# Patient Record
Sex: Male | Born: 1975 | Race: Black or African American | Hispanic: No | Marital: Single | Smoking: Former smoker
Health system: Southern US, Community
[De-identification: ages and names within clinical notes are randomized; demographics above are authoritative.]

---

## 2011-11-27 ENCOUNTER — Emergency Department (HOSPITAL_COMMUNITY): Payer: 59

## 2011-11-27 ENCOUNTER — Encounter (HOSPITAL_COMMUNITY): Payer: Self-pay

## 2011-11-27 ENCOUNTER — Inpatient Hospital Stay (HOSPITAL_COMMUNITY)
Admission: EM | Admit: 2011-11-27 | Discharge: 2011-11-29 | DRG: 089 | Disposition: A | Discharge status: Home / Self Care | Payer: 59 | Attending: Emergency Medicine | Admitting: Emergency Medicine

## 2011-11-27 DIAGNOSIS — D62 Acute posthemorrhagic anemia: Secondary | ICD-10-CM | POA: Diagnosis present

## 2011-11-27 DIAGNOSIS — S32009A Unspecified fracture of unspecified lumbar vertebra, initial encounter for closed fracture: Secondary | ICD-10-CM

## 2011-11-27 DIAGNOSIS — S129XXA Fracture of neck, unspecified, initial encounter: Secondary | ICD-10-CM

## 2011-11-27 DIAGNOSIS — S27329A Contusion of lung, unspecified, initial encounter: Secondary | ICD-10-CM | POA: Diagnosis present

## 2011-11-27 DIAGNOSIS — S301XXA Contusion of abdominal wall, initial encounter: Secondary | ICD-10-CM

## 2011-11-27 DIAGNOSIS — S0100XA Unspecified open wound of scalp, initial encounter: Secondary | ICD-10-CM | POA: Diagnosis present

## 2011-11-27 DIAGNOSIS — S12500A Unspecified displaced fracture of sixth cervical vertebra, initial encounter for closed fracture: Secondary | ICD-10-CM | POA: Diagnosis present

## 2011-11-27 DIAGNOSIS — S060X0A Concussion without loss of consciousness, initial encounter: Principal | ICD-10-CM | POA: Diagnosis present

## 2011-11-27 DIAGNOSIS — S060XAA Concussion with loss of consciousness status unknown, initial encounter: Secondary | ICD-10-CM

## 2011-11-27 DIAGNOSIS — S0101XA Laceration without foreign body of scalp, initial encounter: Secondary | ICD-10-CM

## 2011-11-27 DIAGNOSIS — S0003XA Contusion of scalp, initial encounter: Secondary | ICD-10-CM | POA: Diagnosis present

## 2011-11-27 DIAGNOSIS — H11419 Vascular abnormalities of conjunctiva, unspecified eye: Secondary | ICD-10-CM | POA: Diagnosis present

## 2011-11-27 DIAGNOSIS — S060X9A Concussion with loss of consciousness of unspecified duration, initial encounter: Secondary | ICD-10-CM

## 2011-11-27 LAB — URINALYSIS, MICROSCOPIC ONLY
Leukocytes, UA: NEGATIVE
Nitrite: NEGATIVE
Specific Gravity, Urine: 1.029 (ref 1.005–1.030)
Urobilinogen, UA: 0.2 mg/dL (ref 0.0–1.0)

## 2011-11-27 LAB — CBC
HCT: 41.2 % (ref 39.0–52.0)
Platelets: 184 10*3/uL (ref 150–400)
RDW: 12.4 % (ref 11.5–15.5)
WBC: 7.9 10*3/uL (ref 4.0–10.5)

## 2011-11-27 LAB — COMPREHENSIVE METABOLIC PANEL
Alkaline Phosphatase: 86 U/L (ref 39–117)
BUN: 12 mg/dL (ref 6–23)
GFR calc Af Amer: >90 mL/min (ref >90)
GFR calc non Af Amer: 81 mL/min — ABNORMAL LOW (ref >90)
Glucose, Bld: 93 mg/dL (ref 70–99)
Potassium: 3.6 mEq/L (ref 3.5–5.1)
Total Bilirubin: 0.8 mg/dL (ref 0.3–1.2)
Total Protein: 7.2 g/dL (ref 6.0–8.3)

## 2011-11-27 LAB — POCT I-STAT TROPONIN I: Troponin i, poc: 0 ng/mL (ref 0.00–0.08)

## 2011-11-27 LAB — SAMPLE TO BLOOD BANK

## 2011-11-27 MED ORDER — IOHEXOL 300 MG/ML  SOLN
80.0000 mL | Freq: Once | INTRAMUSCULAR | Status: AC | PRN
  Administered 2011-11-27: 80 mL via INTRAVENOUS

## 2011-11-27 MED ORDER — SODIUM CHLORIDE 0.9 % IV SOLN
INTRAVENOUS | Status: AC | PRN
  Administered 2011-11-27: 50 mL/h via INTRAVENOUS

## 2011-11-27 MED ORDER — ONDANSETRON HCL 4 MG/2ML IJ SOLN
4.0000 mg | Freq: Once | INTRAMUSCULAR | Status: AC
  Administered 2011-11-27: 4 mg via INTRAVENOUS
  Filled 2011-11-27: qty 2

## 2011-11-27 MED ORDER — SODIUM CHLORIDE 0.9 % IV BOLUS (SEPSIS)
1000.0000 mL | Freq: Once | INTRAVENOUS | Status: AC
  Administered 2011-11-27: 1000 mL via INTRAVENOUS

## 2011-11-27 MED ORDER — SODIUM CHLORIDE 0.9 % IV SOLN
Freq: Once | INTRAVENOUS | Status: AC
  Administered 2011-11-27: 15:00:00 via INTRAVENOUS

## 2011-11-27 MED ORDER — POLYETHYLENE GLYCOL 3350 17 G PO PACK
17.0000 g | PACK | Freq: Every day | ORAL | Status: DC
  Administered 2011-11-28 – 2011-11-29 (×2): 17 g via ORAL
  Filled 2011-11-27 (×3): qty 1

## 2011-11-27 MED ORDER — POTASSIUM CHLORIDE IN NACL 20-0.45 MEQ/L-% IV SOLN
INTRAVENOUS | Status: DC
  Administered 2011-11-27: 125 mL/h via INTRAVENOUS
  Administered 2011-11-28: 17:00:00 via INTRAVENOUS
  Administered 2011-11-28: 125 mL/h via INTRAVENOUS
  Administered 2011-11-29: 09:00:00 via INTRAVENOUS
  Filled 2011-11-27 (×10): qty 1000

## 2011-11-27 MED ORDER — BACITRACIN ZINC 500 UNIT/GM EX OINT
TOPICAL_OINTMENT | Freq: Two times a day (BID) | CUTANEOUS | Status: DC
  Administered 2011-11-27 – 2011-11-28 (×2): 1 via TOPICAL
  Administered 2011-11-28 – 2011-11-29 (×2): via TOPICAL
  Filled 2011-11-27: qty 15

## 2011-11-27 MED ORDER — MORPHINE SULFATE 4 MG/ML IJ SOLN
4.0000 mg | INTRAMUSCULAR | Status: DC | PRN
  Administered 2011-11-27 – 2011-11-28 (×2): 4 mg via INTRAVENOUS
  Filled 2011-11-27 (×3): qty 1

## 2011-11-27 MED ORDER — ONDANSETRON HCL 4 MG/2ML IJ SOLN: 4.0000 mg | Freq: Four times a day (QID) | INTRAMUSCULAR | Status: DC | PRN

## 2011-11-27 MED ORDER — METHOCARBAMOL 750 MG PO TABS
1500.0000 mg | ORAL_TABLET | Freq: Four times a day (QID) | ORAL | Status: DC
  Administered 2011-11-27 – 2011-11-29 (×8): 1500 mg via ORAL
  Filled 2011-11-27 (×11): qty 2

## 2011-11-27 MED ORDER — ONDANSETRON HCL 4 MG PO TABS: 4.0000 mg | ORAL_TABLET | Freq: Four times a day (QID) | ORAL | Status: DC | PRN

## 2011-11-27 MED ORDER — CEFAZOLIN SODIUM-DEXTROSE 2-3 GM-% IV SOLR
2.0000 g | Freq: Once | INTRAVENOUS | Status: AC
  Administered 2011-11-27 (×2): 2 g via INTRAVENOUS
  Filled 2011-11-27: qty 50

## 2011-11-27 MED ORDER — FENTANYL CITRATE 0.05 MG/ML IJ SOLN
100.0000 ug | Freq: Once | INTRAMUSCULAR | Status: AC
  Administered 2011-11-27: 100 ug via INTRAVENOUS
  Filled 2011-11-27: qty 2

## 2011-11-27 MED ORDER — DOCUSATE SODIUM 100 MG PO CAPS
100.0000 mg | ORAL_CAPSULE | Freq: Two times a day (BID) | ORAL | Status: DC
  Administered 2011-11-27 – 2011-11-29 (×4): 100 mg via ORAL
  Filled 2011-11-27 (×4): qty 1

## 2011-11-27 MED ORDER — OXYCODONE HCL 5 MG PO TABS
5.0000 mg | ORAL_TABLET | ORAL | Status: DC | PRN
  Administered 2011-11-27 – 2011-11-29 (×5): 10 mg via ORAL
  Filled 2011-11-27 (×5): qty 2

## 2011-11-27 NOTE — ED Notes (Signed)
Verbal order given by EDP Beaton for temp foley catheter. FC inserted by Darius NT. Darius reported to RN no urine output upon inserting FC, pt denied any pain to area. Pt's sensation is intact to BLE. Pt does reports pain to (R) lower back

## 2011-11-27 NOTE — ED Notes (Signed)
Dr Radford Pax wrapping head with ace wrap

## 2011-11-27 NOTE — ED Notes (Addendum)
Foley catheter assessed by Verlon Au RN and Darius NT, no return of urine, FC removed, small amount of blood noted when [facility] was removed. Dr. Wynetta Emery at bedside and informed of no urine returned. Pt denies the urge to urinate

## 2011-11-27 NOTE — ED Notes (Signed)
Accompanying patient to CT per MD request, pt on cardiac monitoring, still bleeding from head.
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
## 2011-11-27 NOTE — ED Notes (Signed)
Dr. Janee Morn aware pt's foley catheter removed d/t no urine output since insertion and blood drainage noted upon removing fc. Dr. Janee Morn wants to wait to see if pt will void on his own, he will f/u w/pt's status
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
## 2011-11-27 NOTE — ED Notes (Signed)
Family updated as to patient's status per MD Janee Morn.
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
## 2011-11-28 ENCOUNTER — Inpatient Hospital Stay (HOSPITAL_COMMUNITY): Payer: 59

## 2011-11-28 DIAGNOSIS — S0101XA Laceration without foreign body of scalp, initial encounter: Secondary | ICD-10-CM

## 2011-11-28 DIAGNOSIS — S060X9A Concussion with loss of consciousness of unspecified duration, initial encounter: Secondary | ICD-10-CM

## 2011-11-28 DIAGNOSIS — S301XXA Contusion of abdominal wall, initial encounter: Secondary | ICD-10-CM

## 2011-11-28 DIAGNOSIS — S3013XA Contusion of flank (latus) region, initial encounter: Secondary | ICD-10-CM

## 2011-11-28 DIAGNOSIS — S129XXA Fracture of neck, unspecified, initial encounter: Secondary | ICD-10-CM

## 2011-11-28 DIAGNOSIS — S32009A Unspecified fracture of unspecified lumbar vertebra, initial encounter for closed fracture: Secondary | ICD-10-CM

## 2011-11-28 LAB — CBC
MCH: 30.8 pg (ref 26.0–34.0)
MCHC: 35.7 g/dL (ref 30.0–36.0)
MCV: 86.2 fL (ref 78.0–100.0)
Platelets: 177 10*3/uL (ref 150–400)
RDW: 12.7 % (ref 11.5–15.5)
WBC: 10.6 10*3/uL — ABNORMAL HIGH (ref 4.0–10.5)

## 2011-11-28 LAB — BASIC METABOLIC PANEL
Calcium: 8 mg/dL — ABNORMAL LOW (ref 8.4–10.5)
Chloride: 106 mEq/L (ref 96–112)
Creatinine, Ser: 1 mg/dL (ref 0.50–1.35)
GFR calc Af Amer: >90 mL/min (ref >90)
GFR calc non Af Amer: >90 mL/min (ref >90)
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
## 2011-11-28 NOTE — Progress Notes (Signed)
Called report to [place], RN on 3300.  Will transport patient via wheelchair to room 3305. [name] C
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
## 2011-11-28 NOTE — Progress Notes (Signed)
Pt voided in toilet, unmeasurable amount, urine was very bloody.
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
## 2011-11-29 LAB — BASIC METABOLIC PANEL
Chloride: 104 mEq/L (ref 96–112)
GFR calc Af Amer: >90 mL/min (ref >90)
Potassium: 3.9 mEq/L (ref 3.5–5.1)
Sodium: 138 mEq/L (ref 135–145)

## 2011-11-29 LAB — CBC
Platelets: 153 10*3/uL (ref 150–400)
RDW: 12.6 % (ref 11.5–15.5)
WBC: 6.7 10*3/uL (ref 4.0–10.5)

## 2011-11-29 MED ORDER — METHOCARBAMOL 500 MG PO TABS: 500.0000 mg | ORAL_TABLET | Freq: Four times a day (QID) | ORAL | Status: AC | PRN

## 2011-11-29 MED ORDER — OXYCODONE-ACETAMINOPHEN 5-325 MG PO TABS: 1.0000 | ORAL_TABLET | ORAL | Status: DC | PRN

## 2011-11-29 NOTE — Progress Notes (Signed)
UR complete 
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
## 2011-11-29 NOTE — Progress Notes (Signed)
Pt to be d/c home. Reviewed discharge instructions & appts.. Pt & family understood.
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
## 2011-11-29 NOTE — Progress Notes (Signed)
The patient is having a lot of back pain.  Will discharge to home this afternoon with pain medications and likely muscle relaxants.  This patient has been seen and I agree with the findings and treatment plan.  Marta Lamas. Gae Bon, MD, FACS [phone] (pager) [phone] (direct pager) Trauma Surgeon

## 2011-12-07 ENCOUNTER — Ambulatory Visit (INDEPENDENT_AMBULATORY_CARE_PROVIDER_SITE_OTHER): Payer: 59 | Admitting: Internal Medicine

## 2011-12-07 ENCOUNTER — Encounter (INDEPENDENT_AMBULATORY_CARE_PROVIDER_SITE_OTHER): Payer: Self-pay

## 2011-12-07 DIAGNOSIS — S0101XA Laceration without foreign body of scalp, initial encounter: Secondary | ICD-10-CM

## 2011-12-07 DIAGNOSIS — S0100XA Unspecified open wound of scalp, initial encounter: Secondary | ICD-10-CM

## 2011-12-07 NOTE — Patient Instructions (Signed)
Keep laceration clean.
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
## 2011-12-14 ENCOUNTER — Ambulatory Visit (INDEPENDENT_AMBULATORY_CARE_PROVIDER_SITE_OTHER): Payer: 59 | Admitting: Orthopedic Surgery

## 2011-12-14 ENCOUNTER — Encounter (INDEPENDENT_AMBULATORY_CARE_PROVIDER_SITE_OTHER): Payer: Self-pay

## 2011-12-14 VITALS — BP 112/82 | HR 80 | Temp 98.2°F | Resp 12 | Ht 66.0 in | Wt 139.0 lb

## 2011-12-14 DIAGNOSIS — S0101XA Laceration without foreign body of scalp, initial encounter: Secondary | ICD-10-CM

## 2011-12-14 DIAGNOSIS — S0100XA Unspecified open wound of scalp, initial encounter: Secondary | ICD-10-CM

## 2011-12-14 DIAGNOSIS — T792XXA Traumatic secondary and recurrent hemorrhage and seroma, initial encounter: Secondary | ICD-10-CM
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
## 2018-08-05 ENCOUNTER — Other Ambulatory Visit: Payer: 59

## 2018-08-05 ENCOUNTER — Other Ambulatory Visit: Payer: Self-pay

## 2018-08-05 ENCOUNTER — Ambulatory Visit: Payer: Self-pay | Admitting: *Deleted

## 2018-08-05 DIAGNOSIS — Z20822 Contact with and (suspected) exposure to covid-19: Secondary | ICD-10-CM

## 2018-08-05 NOTE — Telephone Encounter (Signed)
Patient needs to have testing for work- he does not have PCP- patient lives in county that does not require order for testing- he will go for testing today. Patient does not have symptoms- but will take precautions until his test results return.  Reason for Disposition . [1] COVID-19 EXPOSURE (Close Contact) within last 14 days AND [2] needs COVID-19 lab test to return to work AND [3] NO symptoms  Answer Assessment - Initial Assessment Questions 1. CLOSE CONTACT: "Who is the person with the confirmed or suspected COVID-19 infection that you were exposed to?"     Girlfriend was exposed at work 2. PLACE of CONTACT: "Where were you when you were exposed to COVID-19?" (e.g., home, school, medical waiting room; which city?)     At home by girlfriend 3. TYPE of CONTACT: "How much contact was there?" (e.g., sitting next to, live in same house, work in same office, same building)     Live in same house 4. DURATION of CONTACT: "How long were you in contact with the COVID-19 patient?" (e.g., a few seconds, passed by person, a few minutes, live with the patient)     Same work area 5. DATE of CONTACT: "When did you have contact with a COVID-19 patient?" (e.g., how many days ago)     Last week 6. TRAVEL: "Have you traveled out of the country recently?" If so, "When and where?"     * Also ask about out-of-state travel, since the CDC has identified some high-risk cities for community spread in the [place].     * Note: Travel becomes less relevant if there is widespread community transmission where the patient lives.     No travel 7. COMMUNITY SPREAD: "Are there lots of cases of COVID-19 (community spread) where you live?" (See public health department website, if unsure)       Minor spread 8. SYMPTOMS: "Do you have any symptoms?" (e.g., fever, cough, breathing difficulty)     no 9. PREGNANCY OR POSTPARTUM: "Is there any chance you are pregnant?" "When was your last menstrual period?" "Did you deliver in the last  2 weeks?"     n/a 10. HIGH RISK: "Do you have any heart or lung problems? Do you have a weak immune system?" (e.g., CHF, COPD, asthma, HIV positive, chemotherapy, renal failure, diabetes mellitus, sickle cell anemia)       no  Protocols used: CORONAVIRUS (COVID-19) EXPOSURE-A-AH

## 2018-08-10 LAB — NOVEL CORONAVIRUS, NAA: SARS-CoV-2, NAA: NOT DETECTED

## 2019-02-14 ENCOUNTER — Ambulatory Visit: Payer: 59 | Attending: Internal Medicine

## 2019-02-14 ENCOUNTER — Other Ambulatory Visit: Payer: Self-pay

## 2019-02-14 DIAGNOSIS — Z20822 Contact with and (suspected) exposure to covid-19: Secondary | ICD-10-CM

## 2019-02-15 LAB — NOVEL CORONAVIRUS, NAA: SARS-CoV-2, NAA: NOT DETECTED

## 2022-12-27 ENCOUNTER — Ambulatory Visit (INDEPENDENT_AMBULATORY_CARE_PROVIDER_SITE_OTHER): Payer: BC Managed Care – PPO | Admitting: Internal Medicine

## 2022-12-27 ENCOUNTER — Encounter: Payer: Self-pay | Admitting: Internal Medicine

## 2022-12-27 VITALS — BP 156/104 | HR 88 | Ht 65.0 in | Wt 148.4 lb

## 2022-12-27 DIAGNOSIS — Z789 Other specified health status: Secondary | ICD-10-CM | POA: Insufficient documentation

## 2022-12-27 DIAGNOSIS — I160 Hypertensive urgency: Secondary | ICD-10-CM | POA: Diagnosis not present

## 2022-12-27 DIAGNOSIS — R739 Hyperglycemia, unspecified: Secondary | ICD-10-CM

## 2022-12-27 DIAGNOSIS — Z1159 Encounter for screening for other viral diseases: Secondary | ICD-10-CM | POA: Diagnosis not present

## 2022-12-27 DIAGNOSIS — E782 Mixed hyperlipidemia: Secondary | ICD-10-CM

## 2022-12-27 DIAGNOSIS — E559 Vitamin D deficiency, unspecified: Secondary | ICD-10-CM

## 2022-12-27 DIAGNOSIS — Z114 Encounter for screening for human immunodeficiency virus [HIV]: Secondary | ICD-10-CM

## 2022-12-27 MED ORDER — AMLODIPINE BESYLATE 5 MG PO TABS: 5.0000 mg | ORAL_TABLET | Freq: Every day | ORAL | 0 refills | Status: AC

## 2022-12-27 NOTE — Assessment & Plan Note (Signed)
Has been cutting down alcohol use Needs to avoid binge drinking, had about 5 drinks yesterday Check CMP

## 2022-12-27 NOTE — Patient Instructions (Signed)
Please start taking Amlodipine as prescribed.  Please continue to follow low carb diet and perform moderate exercise/walking at least 150 mins/week.  Please get fasting blood tests done within a week.

## 2022-12-27 NOTE — Assessment & Plan Note (Addendum)
BP Readings from Last 1 Encounters:  12/27/22 (!) 156/104   New onset Uncontrolled Since it is significantly elevated, started Amlodipine 5 mg QD Advised to cut down alcohol intake, avoid binge drinking Check CMP and lipid profile Counseled for compliance with the medications Advised DASH diet and moderate exercise/walking, at least 150 mins/week   EKG: Sinus rhythm. No signs of active ischemia. Large R waves in V4, V5 - could be LVH from long-standing HTN.

## 2022-12-27 NOTE — Progress Notes (Signed)
New Patient Office Visit  Subjective:  Patient ID: Mark Barrera, male    DOB: 25-Aug-1975  Age: 47 y.o. MRN: [id number]  CC:  Chief Complaint  Patient presents with   Establish Care    HPI Mark Barrera is a 47 y.o. male with past medical history of traumatic cervical and lumbar fracture in 2013 who presents for establishing care.  His blood pressure was significantly elevated today.  He has been not had medical evaluation for many years.  He currently denies any headache, dizziness, chest pain, dyspnea or palpitations.  Reports taking alcohol about 2 days in a week now, had about 5 drinks last night.  He reports that he has cut down from daily alcohol consumption, does not drink beer anymore.  Denies smoking, but reports vaping.  History reviewed. No pertinent past medical history.  History reviewed. No pertinent surgical history.  History reviewed. No pertinent family history.  Social History   Socioeconomic History   Marital status: Single    Spouse name: Not on file   Number of children: Not on file   Years of education: Not on file   Highest education level: Not on file  Occupational History   Not on file  Tobacco Use   Smoking status: Former    Types: Cigarettes   Smokeless tobacco: Not on file  Vaping Use   Vaping status: Some Days  Substance and Sexual Activity   Alcohol use: Yes    Comment: social   Drug use: No   Sexual activity: Not on file  Other Topics Concern   Not on file  Social History Narrative   Not on file   Social Determinants of Health   Financial Resource Strain: Not on file  Food Insecurity: Not on file  Transportation Needs: Not on file  Physical Activity: Not on file  Stress: Not on file  Social Connections: Not on file  Intimate Partner Violence: Not on file    ROS Review of Systems  Constitutional:  Negative for chills and fever.  HENT:  Negative for congestion and sore throat.   Eyes:  Negative for pain and discharge.   Respiratory:  Negative for cough and shortness of breath.   Cardiovascular:  Negative for chest pain and palpitations.  Gastrointestinal:  Negative for constipation, diarrhea, nausea and vomiting.  Endocrine: Negative for polydipsia and polyuria.  Genitourinary:  Negative for dysuria and hematuria.  Musculoskeletal:  Negative for neck pain and neck stiffness.  Skin:  Negative for rash.  Neurological:  Negative for dizziness, weakness, numbness and headaches.  Psychiatric/Behavioral:  Negative for agitation and behavioral problems.     Objective:   Today's Vitals: BP (!) 156/104 (BP Location: Left Arm)   Pulse 88   Ht 5\' 5"  (1.651 m)   Wt 148 lb 6.4 oz (67.3 kg)   SpO2 98%   BMI 24.70 kg/m   Physical Exam Vitals reviewed.  Constitutional:      General: He is not in acute distress.    Appearance: He is not diaphoretic.  HENT:     Head: Normocephalic and atraumatic.     Nose: Nose normal.     Mouth/Throat:     Mouth: Mucous membranes are moist.  Eyes:     General: No scleral icterus.    Extraocular Movements: Extraocular movements intact.  Cardiovascular:     Rate and Rhythm: Normal rate and regular rhythm.     Heart sounds: Normal heart sounds. No murmur heard. Pulmonary:  Breath sounds: Normal breath sounds. No wheezing or rales.  Abdominal:     Palpations: Abdomen is soft.     Tenderness: There is no abdominal tenderness.  Musculoskeletal:     Cervical back: Neck supple. No tenderness.     Right lower leg: No edema.     Left lower leg: No edema.  Skin:    General: Skin is warm.     Findings: No rash.  Neurological:     General: No focal deficit present.     Mental Status: He is alert and oriented to person, place, and time.     Cranial Nerves: No cranial nerve deficit.     Sensory: No sensory deficit.     Motor: No weakness.  Psychiatric:        Mood and Affect: Mood normal.        Behavior: Behavior normal.     Assessment & Plan:   Problem List  Items Addressed This Visit       Cardiovascular and Mediastinum   Hypertensive urgency - Primary    BP Readings from Last 1 Encounters:  12/27/22 (!) 156/104   New onset Uncontrolled Since it is significantly elevated, started Amlodipine 5 mg QD Advised to cut down alcohol intake, avoid binge drinking Check CMP and lipid profile Counseled for compliance with the medications Advised DASH diet and moderate exercise/walking, at least 150 mins/week   EKG: Sinus rhythm. No signs of active ischemia. Large R waves in V4, V5 - could be LVH from long-standing HTN.      Relevant Medications   amLODipine (NORVASC) 5 MG tablet   Other Relevant Orders   EKG 12-Lead (Completed)   TSH   CMP14+EGFR   CBC with Differential/Platelet     Other   Alcohol use    Has been cutting down alcohol use Needs to avoid binge drinking, had about 5 drinks yesterday Check CMP      Other Visit Diagnoses     Mixed hyperlipidemia       Relevant Medications   amLODipine (NORVASC) 5 MG tablet   Other Relevant Orders   Lipid panel   Need for hepatitis C screening test       Relevant Orders   HIV antibody (with reflex)   Screening for HIV (human immunodeficiency virus)       Relevant Orders   Hepatitis C Antibody   Hyperglycemia       Relevant Orders   Hemoglobin A1c   Vitamin D deficiency       Relevant Orders   VITAMIN D 25 Hydroxy (Vit-D Deficiency, Fractures)       Outpatient Encounter Medications as of 12/27/2022  Medication Sig   amLODipine (NORVASC) 5 MG tablet Take 1 tablet (5 mg total) by mouth daily.   aspirin 325 MG tablet Take 325-650 mg by mouth daily as needed. For headaches (Patient not taking: Reported on 12/27/2022)   methocarbamol (ROBAXIN) 500 MG tablet Take 1-2 tablets (500-1,000 mg total) by mouth every 6 (six) hours as needed (muscle spasm). (Patient not taking: Reported on 12/27/2022)   [DISCONTINUED] Chlorphen-Phenyleph-ASA (ALKA-SELTZER PLUS COLD PO) Take 2 tablets by  mouth 2 (two) times daily as needed. For cold symptoms (Patient not taking: Reported on 12/27/2022)   [DISCONTINUED] oxyCODONE-acetaminophen (ROXICET) 5-325 MG per tablet Take 1-2 tablets by mouth every 4 (four) hours as needed for pain. (Patient not taking: Reported on 12/27/2022)   No facility-administered encounter medications on file as of 12/27/2022.    Follow-up:  Return in about 4 weeks (around 01/24/2023) for HTN.   Anabel Halon, MD

## 2023-01-02 DIAGNOSIS — I160 Hypertensive urgency: Secondary | ICD-10-CM | POA: Diagnosis not present

## 2023-01-02 DIAGNOSIS — E559 Vitamin D deficiency, unspecified: Secondary | ICD-10-CM | POA: Diagnosis not present

## 2023-01-02 DIAGNOSIS — E782 Mixed hyperlipidemia: Secondary | ICD-10-CM | POA: Diagnosis not present

## 2023-01-02 DIAGNOSIS — Z1159 Encounter for screening for other viral diseases: Secondary | ICD-10-CM | POA: Diagnosis not present

## 2023-01-02 DIAGNOSIS — Z114 Encounter for screening for human immunodeficiency virus [HIV]: Secondary | ICD-10-CM | POA: Diagnosis not present

## 2023-01-02 DIAGNOSIS — R739 Hyperglycemia, unspecified: Secondary | ICD-10-CM | POA: Diagnosis not present

## 2023-01-03 LAB — LIPID PANEL
Chol/HDL Ratio: 2.9 {ratio} (ref 0.0–5.0)
Cholesterol, Total: 190 mg/dL (ref 100–199)
HDL: 65 mg/dL (ref >39)
LDL Chol Calc (NIH): 106 mg/dL — ABNORMAL HIGH (ref 0–99)
Triglycerides: 106 mg/dL (ref 0–149)
VLDL Cholesterol Cal: 19 mg/dL (ref 5–40)

## 2023-01-03 LAB — CBC WITH DIFFERENTIAL/PLATELET
Basophils Absolute: 0 10*3/uL (ref 0.0–0.2)
Basos: 1 %
EOS (ABSOLUTE): 0.1 10*3/uL (ref 0.0–0.4)
Eos: 1 %
Hematocrit: 47.5 % (ref 37.5–51.0)
Hemoglobin: 15.9 g/dL (ref 13.0–17.7)
Immature Grans (Abs): 0 10*3/uL (ref 0.0–0.1)
Immature Granulocytes: 0 %
Lymphocytes Absolute: 1.7 10*3/uL (ref 0.7–3.1)
Lymphs: 25 %
MCH: 30.8 pg (ref 26.6–33.0)
MCHC: 33.5 g/dL (ref 31.5–35.7)
MCV: 92 fL (ref 79–97)
Monocytes Absolute: 0.5 10*3/uL (ref 0.1–0.9)
Monocytes: 8 %
Neutrophils Absolute: 4.4 10*3/uL (ref 1.4–7.0)
Neutrophils: 65 %
Platelets: 240 10*3/uL (ref 150–450)
RBC: 5.17 x10E6/uL (ref 4.14–5.80)
RDW: 12.6 % (ref 11.6–15.4)
WBC: 6.8 10*3/uL (ref 3.4–10.8)

## 2023-01-03 LAB — CMP14+EGFR
ALT: 13 [IU]/L (ref 0–44)
AST: 14 [IU]/L (ref 0–40)
Albumin: 4.8 g/dL (ref 4.1–5.1)
Alkaline Phosphatase: 107 [IU]/L (ref 44–121)
BUN/Creatinine Ratio: 11 (ref 9–20)
BUN: 12 mg/dL (ref 6–24)
Bilirubin Total: 0.7 mg/dL (ref 0.0–1.2)
CO2: 27 mmol/L (ref 20–29)
Calcium: 9.8 mg/dL (ref 8.7–10.2)
Chloride: 103 mmol/L (ref 96–106)
Creatinine, Ser: 1.13 mg/dL (ref 0.76–1.27)
Globulin, Total: 2.5 g/dL (ref 1.5–4.5)
Glucose: 90 mg/dL (ref 70–99)
Potassium: 4 mmol/L (ref 3.5–5.2)
Sodium: 143 mmol/L (ref 134–144)
Total Protein: 7.3 g/dL (ref 6.0–8.5)
eGFR: 80 mL/min/{1.73_m2} (ref >59)

## 2023-01-03 LAB — VITAMIN D 25 HYDROXY (VIT D DEFICIENCY, FRACTURES): Vit D, 25-Hydroxy: 31.7 ng/mL (ref 30.0–100.0)

## 2023-01-03 LAB — HEMOGLOBIN A1C
Est. average glucose Bld gHb Est-mCnc: 111 mg/dL
Hgb A1c MFr Bld: 5.5 % (ref 4.8–5.6)

## 2023-01-03 LAB — HIV ANTIBODY (ROUTINE TESTING W REFLEX): HIV Screen 4th Generation wRfx: NONREACTIVE

## 2023-01-03 LAB — HEPATITIS C ANTIBODY: Hep C Virus Ab: NONREACTIVE

## 2023-01-03 LAB — TSH: TSH: 3.82 u[IU]/mL (ref 0.450–4.500)

## 2023-01-08 ENCOUNTER — Telehealth: Payer: Self-pay | Admitting: Internal Medicine

## 2023-01-08 NOTE — Telephone Encounter (Signed)
error 

## 2023-01-08 NOTE — Telephone Encounter (Signed)
Patient called after call hours left a message asking for test / lab results. Call back # [phone].

## 2023-01-08 NOTE — Telephone Encounter (Signed)
Records show that Mark Barrera spoke to him on 12/4 and gave him lab results and he verbalized understanding

## 2023-02-08 ENCOUNTER — Ambulatory Visit: Payer: BC Managed Care – PPO | Admitting: Internal Medicine
